# Patient Record
Sex: Female | Born: 2000 | Race: Black or African American | Hispanic: No | Smoking: Never smoker
Health system: Southern US, Community
[De-identification: ages and names within clinical notes are randomized; demographics above are authoritative.]

## PROBLEM LIST (undated history)

## (undated) DIAGNOSIS — J45909 Unspecified asthma, uncomplicated: Secondary | ICD-10-CM

## (undated) DIAGNOSIS — R7303 Prediabetes: Secondary | ICD-10-CM

## (undated) HISTORY — PX: OTHER SURGICAL HISTORY: SHX169

## (undated) HISTORY — DX: Prediabetes: R73.03

## (undated) HISTORY — DX: Unspecified asthma, uncomplicated: J45.909

---
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
## 2021-10-28 ENCOUNTER — Telehealth: Payer: Self-pay | Admitting: Family Medicine

## 2021-10-28 NOTE — Telephone Encounter (Signed)
Client with c/o nausea, vomiting, abdominal pain, back pain, constipation and inability to keep food down. States able to keep down water. Encouraged evaluation with PCP or ED evaluation. Jossie Ng, RN

## 2021-10-28 NOTE — Telephone Encounter (Signed)
Pt is scheduled for a PT on June 5th but she is already having pregnancy symptoms and hasn't been able to keep any food down so she is concerned and would like to speak to a nurse about what she can do about her symptoms.

## 2021-11-07 ENCOUNTER — Other Ambulatory Visit: Payer: Self-pay

## 2021-11-07 ENCOUNTER — Encounter: Payer: Self-pay | Admitting: Emergency Medicine

## 2021-11-07 ENCOUNTER — Emergency Department: Payer: Medicaid Other

## 2021-11-07 ENCOUNTER — Emergency Department
Admission: EM | Admit: 2021-11-07 | Discharge: 2021-11-07 | Disposition: A | Discharge status: Home / Self Care | Payer: Medicaid Other | Attending: Emergency Medicine | Admitting: Emergency Medicine

## 2021-11-07 DIAGNOSIS — O211 Hyperemesis gravidarum with metabolic disturbance: Secondary | ICD-10-CM | POA: Insufficient documentation

## 2021-11-07 DIAGNOSIS — K5900 Constipation, unspecified: Secondary | ICD-10-CM | POA: Diagnosis not present

## 2021-11-07 DIAGNOSIS — O99611 Diseases of the digestive system complicating pregnancy, first trimester: Secondary | ICD-10-CM | POA: Insufficient documentation

## 2021-11-07 DIAGNOSIS — R42 Dizziness and giddiness: Secondary | ICD-10-CM | POA: Insufficient documentation

## 2021-11-07 DIAGNOSIS — R102 Pelvic and perineal pain: Secondary | ICD-10-CM

## 2021-11-07 DIAGNOSIS — R112 Nausea with vomiting, unspecified: Secondary | ICD-10-CM

## 2021-11-07 DIAGNOSIS — Z3A10 10 weeks gestation of pregnancy: Secondary | ICD-10-CM | POA: Diagnosis not present

## 2021-11-07 DIAGNOSIS — O219 Vomiting of pregnancy, unspecified: Secondary | ICD-10-CM | POA: Diagnosis present

## 2021-11-07 DIAGNOSIS — E876 Hypokalemia: Secondary | ICD-10-CM

## 2021-11-07 LAB — URINALYSIS, ROUTINE W REFLEX MICROSCOPIC
Bilirubin Urine: NEGATIVE
Glucose, UA: NEGATIVE mg/dL
Hgb urine dipstick: NEGATIVE
Ketones, ur: NEGATIVE mg/dL
Leukocytes,Ua: NEGATIVE
Nitrite: NEGATIVE
Protein, ur: 30 mg/dL — AB
Specific Gravity, Urine: 1.023 (ref 1.005–1.030)
pH: 5 (ref 5.0–8.0)

## 2021-11-07 LAB — CBC
HCT: 39.1 % (ref 36.0–46.0)
Hemoglobin: 12.3 g/dL (ref 12.0–15.0)
MCH: 25.7 pg — ABNORMAL LOW (ref 26.0–34.0)
MCHC: 31.5 g/dL (ref 30.0–36.0)
MCV: 81.6 fL (ref 80.0–100.0)
Platelets: 206 10*3/uL (ref 150–400)
RBC: 4.79 MIL/uL (ref 3.87–5.11)
RDW: 13.2 % (ref 11.5–15.5)
WBC: 4.6 10*3/uL (ref 4.0–10.5)
nRBC: 0 % (ref 0.0–0.2)

## 2021-11-07 LAB — COMPREHENSIVE METABOLIC PANEL
ALT: 41 U/L (ref 0–44)
AST: 27 U/L (ref 15–41)
Albumin: 4.2 g/dL (ref 3.5–5.0)
Alkaline Phosphatase: 70 U/L (ref 38–126)
Anion gap: 9 (ref 5–15)
BUN: 7 mg/dL (ref 6–20)
CO2: 23 mmol/L (ref 22–32)
Calcium: 9.8 mg/dL (ref 8.9–10.3)
Chloride: 105 mmol/L (ref 98–111)
Creatinine, Ser: 0.4 mg/dL — ABNORMAL LOW (ref 0.44–1.00)
GFR, Estimated: >60 mL/min (ref >60)
Glucose, Bld: 99 mg/dL (ref 70–99)
Potassium: 3 mmol/L — ABNORMAL LOW (ref 3.5–5.1)
Sodium: 137 mmol/L (ref 135–145)
Total Bilirubin: 0.6 mg/dL (ref 0.3–1.2)
Total Protein: 8.2 g/dL — ABNORMAL HIGH (ref 6.5–8.1)

## 2021-11-07 LAB — POC URINE PREG, ED: Preg Test, Ur: POSITIVE — AB

## 2021-11-07 LAB — LIPASE, BLOOD: Lipase: 35 U/L (ref 11–51)

## 2021-11-07 LAB — HCG, QUANTITATIVE, PREGNANCY: hCG, Beta Chain, Quant, S: 83833 m[IU]/mL — ABNORMAL HIGH (ref <5)

## 2021-11-07 MED ORDER — POTASSIUM CHLORIDE CRYS ER 20 MEQ PO TBCR
40.0000 meq | EXTENDED_RELEASE_TABLET | Freq: Once | ORAL | Status: AC
Start: 2021-11-07 — End: 2021-11-07
  Administered 2021-11-07: 40 meq via ORAL
  Filled 2021-11-07: qty 2

## 2021-11-07 MED ORDER — DEXTROSE 5 % IN LACTATED RINGERS IV BOLUS
2000.0000 mL | Freq: Once | INTRAVENOUS | Status: DC
  Filled 2021-11-07: qty 2000

## 2021-11-07 MED ORDER — DEXTROSE IN LACTATED RINGERS 5 % IV SOLN
1000.0000 mL | Freq: Once | INTRAVENOUS | Status: AC
  Administered 2021-11-07: 1000 mL via INTRAVENOUS

## 2021-11-07 NOTE — ED Provider Notes (Signed)
[facility] Provider Note    Event Date/Time   First MD Initiated Contact with Patient 11/07/21 1301     (approximate)   History   multiple medical complaints (Dizziness, vomiting, constipation, pregnancy)   HPI  Tanya Lambert is a 21 y.o. female who presents to the ED for evaluation of multiple medical complaints (Dizziness, vomiting, constipation, pregnancy)   Patient presents to the ED with her partner for evaluation of 3 weeks of feeling badly.  She reports feeling "weird," reports recurrent nausea and emesis.  Reports lower abdominal cramping.  Reports waxing and waning constipation and diarrhea. Reports she is currently 8 or [redacted] weeks pregnant.  This was unwanted.  Has not seen OB yet.  No vaginal bleeding or new discharge, no fever, syncope or falls.   Physical Exam   Triage Vital Signs: ED Triage Vitals  Enc Vitals Group     BP 11/07/21 1231 135/80     Pulse Rate 11/07/21 1231 (!) 114     Resp 11/07/21 1231 16     Temp 11/07/21 1231 98.8 F (37.1 C)     Temp Source 11/07/21 1231 Oral     SpO2 11/07/21 1231 100 %     Weight --      Height --      Head Circumference --      Peak Flow --      Pain Score 11/07/21 1229 3     Pain Loc --      Pain Edu? --      Excl. in GC? --     Most recent vital signs: Vitals:   11/07/21 1231 11/07/21 1601  BP: 135/80 (!) 110/45  Pulse: (!) 114 81  Resp: 16 18  Temp: 98.8 F (37.1 C)   SpO2: 100% 100%    General: Awake, no distress.  Well-appearing. CV:  Good peripheral perfusion.  Resp:  Normal effort.  Abd:  No distention.  Soft and benign MSK:  No deformity noted.  Neuro:  No focal deficits appreciated. Other:     ED Results / Procedures / Treatments   Labs (all labs ordered are listed, but only abnormal results are displayed) Labs Reviewed  COMPREHENSIVE METABOLIC PANEL - Abnormal; Notable for the following components:      Result Value   Potassium 3.0 (*)    Creatinine, Ser 0.40  (*)    Total Protein 8.2 (*)    All other components within normal limits  CBC - Abnormal; Notable for the following components:   MCH 25.7 (*)    All other components within normal limits  URINALYSIS, ROUTINE W REFLEX MICROSCOPIC - Abnormal; Notable for the following components:   Color, Urine YELLOW (*)    APPearance HAZY (*)    Protein, ur 30 (*)    Bacteria, UA RARE (*)    All other components within normal limits  HCG, QUANTITATIVE, PREGNANCY - Abnormal; Notable for the following components:   hCG, Beta Chain, Quant, [place] 16,010 (*)    All other components within normal limits  POC URINE PREG, ED - Abnormal; Notable for the following components:   Preg Test, Ur POSITIVE (*)    All other components within normal limits  LIPASE, BLOOD    EKG   RADIOLOGY Obstetric ultrasound interpreted by me with IUP in place  Official radiology report(s): US OB Comp Less 14 Wks  Result Date: 11/07/2021 CLINICAL DATA:  [id number]. Diffuse pelvic pain. Quantitative beta HCG [id number]. Unsure  last menstrual period. EXAM: OBSTETRIC <14 WK [place] AND TRANSVAGINAL OB US TECHNIQUE: Both transabdominal and transvaginal ultrasound examinations were performed for complete evaluation of the gestation as well as the maternal uterus, adnexal regions, and pelvic cul-de-sac. Transvaginal technique was performed to assess early pregnancy. COMPARISON:  None Available. FINDINGS: Intrauterine gestational sac: Single Yolk sac:  Visualized. Embryo:  Visualized. Cardiac Activity: Visualized. Heart Rate: 176  bpm CRL: 36.2  mm   10 w   4 d                  [place] EDC: 06/01/22 Subchorionic hemorrhage:  None visualized. Maternal uterus/adnexae: Bilateral ovaries are unremarkable. Other: No free pelvic fluid. IMPRESSION: Single live intrauterine pregnancy with gestational age of [redacted] weeks and 4 days. Electronically Signed   By: Tish Frederickson M.D.   On: 11/07/2021 15:25    PROCEDURES and INTERVENTIONS:  Procedures  Medications   potassium chloride SA (KLOR-CON M) CR tablet 40 mEq (40 mEq Oral Given 11/07/21 1522)  dextrose 5 % in lactated ringers infusion (1,000 mLs Intravenous New Bag/Given 11/07/21 1523)    Followed by  dextrose 5 % in lactated ringers infusion (1,000 mLs Intravenous New Bag/Given 11/07/21 1521)     IMPRESSION / MDM / ASSESSMENT AND PLAN / ED COURSE  I reviewed the triage vital signs and the nursing notes.  Differential diagnosis includes, but is not limited to, ectopic pregnancy, hyperemesis gravidarum, dehydration, hypokalemia, miscarriage  {Patient presents with symptoms of an acute illness or injury that is potentially life-threatening.  21 year old female presents to the ED with first trimester emesis, dizziness.  Has some signs of hypokalemia and mild dehydration resolved with IV fluids.  Urine without infectious features and CBC is normal.  Normal lipase.  hCG is appropriately elevated for her gestational age.  Potential ultrasound obtained due to her pain and this is without evidence of acute pathology, IUP in place.  No ectopic.  Tolerating p.o. and improved symptoms with fluids and repletion of her potassium.  We will discharge with referral to OB/GYN and return precautions for the ED.  Clinical Course as of 11/07/21 1700  Fri Nov 07, 2021  1559 Reassessed.  Feeling better.  We discussed reassuring ultrasound [DS]  1655 Reassessed.  Feeling much better after the IV fluids.  Tachycardia has resolved. [DS]    Clinical Course User Index [DS] Delton Prairie, MD     FINAL CLINICAL IMPRESSION(S) / ED DIAGNOSES   Final diagnoses:  [redacted] weeks gestation of pregnancy  Nausea and vomiting, unspecified vomiting type  Hypokalemia     Rx / DC Orders   ED Discharge Orders     None        Note:  This document was prepared using Dragon voice recognition software and may include unintentional dictation errors.   Delton Prairie, MD 11/07/21 [phone]

## 2021-11-07 NOTE — ED Triage Notes (Signed)
Pt to ED via POV when asked why she is here pt states that she is having nausea, vomiting, constipation, abdominal pain, weight loss, and pregnancy. Pt states that she is approximately 8 weeks pregnancy, G1P0

## 2021-11-07 NOTE — Discharge Instructions (Addendum)
Use Tylenol for pain and fevers.  Up to 1000 mg per dose, up to 4 times per day.  Do not take more than 4000 mg of Tylenol/acetaminophen within 24 hours..  

## 2021-11-07 NOTE — ED Notes (Signed)
Pt w/ [place] at this time

## 2022-02-26 ENCOUNTER — Ambulatory Visit: Payer: Medicaid Other | Admitting: Physician Assistant

## 2022-02-26 ENCOUNTER — Other Ambulatory Visit: Payer: Self-pay

## 2022-02-26 ENCOUNTER — Encounter: Payer: Self-pay | Admitting: Advanced Practice Midwife

## 2022-02-26 VITALS — BP 117/67 | HR 78 | Temp 97.6°F | Wt 209.0 lb

## 2022-02-26 DIAGNOSIS — Z3402 Encounter for supervision of normal first pregnancy, second trimester: Secondary | ICD-10-CM | POA: Diagnosis not present

## 2022-02-26 DIAGNOSIS — Z23 Encounter for immunization: Secondary | ICD-10-CM | POA: Diagnosis not present

## 2022-02-26 DIAGNOSIS — O0932 Supervision of pregnancy with insufficient antenatal care, second trimester: Secondary | ICD-10-CM

## 2022-02-26 DIAGNOSIS — O99012 Anemia complicating pregnancy, second trimester: Secondary | ICD-10-CM

## 2022-02-26 DIAGNOSIS — O99212 Obesity complicating pregnancy, second trimester: Secondary | ICD-10-CM

## 2022-02-26 DIAGNOSIS — Z8709 Personal history of other diseases of the respiratory system: Secondary | ICD-10-CM

## 2022-02-26 LAB — HEMOGLOBIN, FINGERSTICK: Hemoglobin: 10.3 g/dL — ABNORMAL LOW (ref 11.1–15.9)

## 2022-02-26 MED ORDER — IRON (FERROUS SULFATE) 325 (65 FE) MG PO TABS: 1.0000 | ORAL_TABLET | Freq: Every day | ORAL | 0 refills | Status: AC

## 2022-02-26 MED ORDER — PROMETHAZINE HCL 25 MG PO TABS: 25.0000 mg | ORAL_TABLET | Freq: Four times a day (QID) | ORAL | 2 refills | Status: DC | PRN

## 2022-02-26 NOTE — Progress Notes (Signed)
Presents for initiation of prenatal care. To [facility] ED 11/07/21 for N/V, dizziness and constipation. US done with EGA = 10 4/7 and EDD = 06/01/2022. Does not remember LMP or pregravid wt. Last wt she remembers is 199# taken on 11/07/2021. Peak flows = 410, 460, 460 with good effort and A. Streilein PA-C aware. Rich Number, RN Hgb = 10.3 and iron initiated per standing orders. [facility] [place] referral with snapshot pages and [place] from 11/07/21 faxed. Fax confirmation received. Client counseled [facility] will notify her of [place] appt via phone call and will only make 1 call QD x3 in effort to reach her. Per client, she will clear out phone messages in voicemail. Rich Number, RN

## 2022-02-26 NOTE — Progress Notes (Signed)
[place] Department  Maternal Health Clinic   INITIAL PRENATAL VISIT NOTE  Subjective:  Tanya Lambert is a 21 y.o. G1P0 at [redacted]w[redacted]d being seen today to start prenatal care at the [facility].  She is currently monitored for the following issues for this high-risk pregnancy and has Late prenatal care affecting pregnancy in second trimester; Encounter for supervision of normal first pregnancy in second trimester; Obesity affecting pregnancy in second trimester; Hx of intrinsic asthma; and Anemia affecting pregnancy in second trimester on their problem list.  Patient reports  occ nausea, bilat painful hardness some areas breasts (has been massaging them to promote milk supply in anticipation of breastfeeding) .  Contractions: Not present. Vag. Bleeding: None.  Movement: Present. Denies leaking of fluid.   Indications for ASA therapy (per uptodate) One of the following: Previous pregnancy with preeclampsia, especially early onset and with an adverse outcome No Multifetal gestation No Chronic hypertension No Type 1 or 2 diabetes mellitus No Chronic kidney disease No Autoimmune disease (antiphospholipid syndrome, systemic lupus erythematosus) No  Two or more of the following: Nulliparity Yes Obesity (body mass index >30 kg/m2) Yes Family history of preeclampsia in mother or sister No Age ?35 years No Sociodemographic characteristics (African American race, low socioeconomic level) Yes Personal risk factors (eg, previous pregnancy with low birth weight or small for gestational age infant, previous adverse pregnancy outcome [eg, stillbirth], interval >10 years between pregnancies) No   The following portions of the patient's history were reviewed and updated as appropriate: allergies, current medications, past family history, past medical history, past social history, past surgical history and problem list. Problem list updated.  Objective:   Vitals:   02/26/22  0845  BP: 117/67  Pulse: 78  Temp: 97.6 F (36.4 C)  Weight: 209 lb (94.8 kg)    Fetal Status: Fetal Heart Rate (bpm): 148 Fundal Height: 25 cm Movement: Present  Presentation: Undeterminable  Physical Exam Vitals and nursing note reviewed.  Constitutional:      General: She is not in acute distress.    Appearance: Normal appearance. She is well-developed. She is obese.  HENT:     Head: Normocephalic and atraumatic.     Right Ear: External ear normal.     Left Ear: External ear normal.     Nose: Nose normal. No congestion or rhinorrhea.     Mouth/Throat:     Lips: Pink.     Mouth: Mucous membranes are moist.     Dentition: Normal dentition. No dental caries.     Pharynx: Oropharynx is clear. Uvula midline.     Comments: Dentition: teeth in good repair Eyes:     General: No scleral icterus.    Conjunctiva/sclera: Conjunctivae normal.  Neck:     Thyroid: No thyroid mass or thyromegaly.  Cardiovascular:     Rate and Rhythm: Normal rate and regular rhythm.     Pulses: Normal pulses.     Heart sounds: Normal heart sounds.     Comments: Extremities are warm and well perfused Pulmonary:     Effort: Pulmonary effort is normal.     Breath sounds: Normal breath sounds.  Chest:     Chest wall: No mass.  Breasts:    Tanner Score is 5.     Breasts are symmetrical.     Right: Normal. No mass, nipple discharge or skin change.     Left: Normal. No mass, nipple discharge or skin change.  Abdominal:  General: Abdomen is protuberant.     Palpations: Abdomen is soft.     Tenderness: There is no abdominal tenderness.     Comments: Gravid   Genitourinary:    General: Normal vulva.     Exam position: Lithotomy position.     Pubic Area: No rash.      Labia:        Right: No rash.        Left: No rash.      Vagina: Normal. No vaginal discharge.     Cervix: No cervical motion tenderness or friability.     Uterus: Normal. Enlarged (Gravid >20 week size). Not tender.      Adnexa:  Right adnexa normal and left adnexa normal.     Rectum: Normal. No external hemorrhoid.  Musculoskeletal:     Right lower leg: No edema.     Left lower leg: No edema.  Lymphadenopathy:     Cervical: No cervical adenopathy.     Upper Body:     Right upper body: No axillary adenopathy.     Left upper body: No axillary adenopathy.  Skin:    General: Skin is warm.     Capillary Refill: Capillary refill takes less than 2 seconds.  Neurological:     Mental Status: She is alert and oriented to person, place, and time.     Assessment and Plan:  Pregnancy: G1P0 at [redacted]w[redacted]d  1. Encounter for supervision of normal first pregnancy in second trimester Indications for aspirin, but too late in pregnancy for clear benefit. Schedule fetal anat [place]. Discussed non-pharm and pharm options for nausea of preg, pt desired Rx. - Prenatal Profile I - Hgb Fractionation Cascade - Glucose, 1 hour - Hgb A1c w/o eAG - Comprehensive metabolic panel - TSH - Protein / creatinine ratio, urine - MaterniT 21 plus Core, Blood - Hemoglobin, venipuncture - promethazine (PHENERGAN) 25 MG tablet; Take 1 tablet (25 mg total) by mouth every 6 (six) hours as needed for nausea or vomiting.  Dispense: 30 tablet; Refill: 2  2. Obesity affecting pregnancy in second trimester Discussed 11-20lb TWG recommendation.  - Amb ref to Medical Nutrition Therapy-MNT  3. Late prenatal care affecting pregnancy in second trimester OBCM referral done.  4. Hx of intrinsic asthma Peak flows done today. Monitor for sx. Last in early childhood.  5. Anemia affecting pregnancy in second trimester Anemia panel today.   Discussed overview of care and coordination with inpatient delivery practices including [facility], [facility], [facility] and [facility].    Preterm labor symptoms and general obstetric precautions including but not limited to vaginal bleeding, contractions, leaking of fluid and fetal movement were reviewed in detail with  the patient.  Please refer to After Visit Summary for other counseling recommendations.   Return in about 2 weeks (around 03/12/2022) for 28 wk labs (before 10:30am or 3pm), Routine prenatal care.  Future Appointments  Date Time Provider [place]  03/09/2022  8:20 AM [facility] PROVIDER [facility] None    Lora Havens, PA-C

## 2022-02-27 LAB — FE+CBC/D/PLT+TIBC+FER+RETIC
Basophils Absolute: 0 10*3/uL (ref 0.0–0.2)
Basos: 0 %
EOS (ABSOLUTE): 0.1 10*3/uL (ref 0.0–0.4)
Eos: 1 %
Ferritin: 16 ng/mL (ref 15–150)
Hematocrit: 33 % — ABNORMAL LOW (ref 34.0–46.6)
Hemoglobin: 10.5 g/dL — ABNORMAL LOW (ref 11.1–15.9)
Immature Grans (Abs): 0 10*3/uL (ref 0.0–0.1)
Immature Granulocytes: 0 %
Iron Saturation: 7 % — CL (ref 15–55)
Iron: 32 ug/dL (ref 27–159)
Lymphocytes Absolute: 1.9 10*3/uL (ref 0.7–3.1)
Lymphs: 23 %
MCH: 28.2 pg (ref 26.6–33.0)
MCHC: 31.8 g/dL (ref 31.5–35.7)
MCV: 89 fL (ref 79–97)
Monocytes Absolute: 0.4 10*3/uL (ref 0.1–0.9)
Monocytes: 5 %
Neutrophils Absolute: 5.7 10*3/uL (ref 1.4–7.0)
Neutrophils: 71 %
Platelets: 287 10*3/uL (ref 150–450)
RBC: 3.72 x10E6/uL — ABNORMAL LOW (ref 3.77–5.28)
RDW: 13.6 % (ref 11.7–15.4)
Retic Ct Pct: 2.5 % (ref 0.6–2.6)
Total Iron Binding Capacity: 444 ug/dL (ref 250–450)
UIBC: 412 ug/dL (ref 131–425)
WBC: 8 10*3/uL (ref 3.4–10.8)

## 2022-02-27 LAB — PROTEIN / CREATININE RATIO, URINE
Creatinine, Urine: 63.1 mg/dL
Protein, Ur: 7.3 mg/dL
Protein/Creat Ratio: 116 mg/g creat (ref 0–200)

## 2022-03-02 ENCOUNTER — Telehealth: Payer: Self-pay

## 2022-03-02 NOTE — Telephone Encounter (Signed)
[facility] [place] referral faxed 02/26/2022 with confirmation received. As no appt yet scheduled, call to [facility] OB US scheduler Tye Maryland). Per Tye Maryland, referral has not been received. [facility] [place] referral, snapshot pages and prior [place] report faxed again with fax confirmation received. Rich Number, RN

## 2022-03-02 NOTE — Telephone Encounter (Signed)
Phone encounter opened in error. Rich Number, RN

## 2022-03-05 NOTE — Progress Notes (Signed)
Per [facility] Epic, [place] appt scheduled for 03/10/2022 at 1100. [place] will be done at [place]. Rich Number, RN

## 2022-03-06 ENCOUNTER — Encounter: Payer: Self-pay | Admitting: Advanced Practice Midwife

## 2022-03-06 DIAGNOSIS — E0590 Thyrotoxicosis, unspecified without thyrotoxic crisis or storm: Secondary | ICD-10-CM | POA: Insufficient documentation

## 2022-03-06 LAB — PREGNANCY, INITIAL SCREEN
Antibody Screen: NEGATIVE
Basophils Absolute: 0 10*3/uL (ref 0.0–0.2)
Basos: 0 %
Bilirubin, UA: NEGATIVE
Chlamydia trachomatis, NAA: NEGATIVE
EOS (ABSOLUTE): 0 10*3/uL (ref 0.0–0.4)
Eos: 1 %
Glucose, UA: NEGATIVE
HCV Ab: NONREACTIVE
HIV Screen 4th Generation wRfx: NONREACTIVE
Hematocrit: 32 % — ABNORMAL LOW (ref 34.0–46.6)
Hemoglobin: 10.2 g/dL — ABNORMAL LOW (ref 11.1–15.9)
Hepatitis B Surface Ag: NEGATIVE
Immature Grans (Abs): 0 10*3/uL (ref 0.0–0.1)
Immature Granulocytes: 0 %
Ketones, UA: NEGATIVE
Leukocytes,UA: NEGATIVE
Lymphocytes Absolute: 1.8 10*3/uL (ref 0.7–3.1)
Lymphs: 22 %
MCH: 27.1 pg (ref 26.6–33.0)
MCHC: 31.9 g/dL (ref 31.5–35.7)
MCV: 85 fL (ref 79–97)
Monocytes Absolute: 0.4 10*3/uL (ref 0.1–0.9)
Monocytes: 5 %
Neisseria Gonorrhoeae by PCR: NEGATIVE
Neutrophils Absolute: 5.8 10*3/uL (ref 1.4–7.0)
Neutrophils: 72 %
Nitrite, UA: NEGATIVE
Platelets: 286 10*3/uL (ref 150–450)
Protein,UA: NEGATIVE
RBC, UA: NEGATIVE
RBC: 3.77 x10E6/uL (ref 3.77–5.28)
RDW: 13.2 % (ref 11.7–15.4)
RPR Ser Ql: NONREACTIVE
Rh Factor: POSITIVE
Rubella Antibodies, IGG: 21 index (ref >0.99)
Specific Gravity, UA: 1.012 (ref 1.005–1.030)
Urobilinogen, Ur: 0.2 mg/dL (ref 0.2–1.0)
WBC: 8.1 10*3/uL (ref 3.4–10.8)
pH, UA: 6.5 (ref 5.0–7.5)

## 2022-03-06 LAB — MATERNIT 21 PLUS CORE, BLOOD
Fetal Fraction: 5
Result (T21): NEGATIVE
Trisomy 13 (Patau syndrome): NEGATIVE
Trisomy 18 (Edwards syndrome): NEGATIVE
Trisomy 21 (Down syndrome): NEGATIVE

## 2022-03-06 LAB — COMPREHENSIVE METABOLIC PANEL
ALT: 11 IU/L (ref 0–32)
AST: 11 IU/L (ref 0–40)
Albumin/Globulin Ratio: 1.5 (ref 1.2–2.2)
Albumin: 3.9 g/dL — ABNORMAL LOW (ref 4.0–5.0)
Alkaline Phosphatase: 57 IU/L (ref 42–106)
BUN/Creatinine Ratio: 7 — ABNORMAL LOW (ref 9–23)
BUN: 3 mg/dL — ABNORMAL LOW (ref 6–20)
Bilirubin Total: <0.2 mg/dL (ref 0.0–1.2)
CO2: 19 mmol/L — ABNORMAL LOW (ref 20–29)
Calcium: 9 mg/dL (ref 8.7–10.2)
Chloride: 104 mmol/L (ref 96–106)
Creatinine, Ser: 0.44 mg/dL — ABNORMAL LOW (ref 0.57–1.00)
Globulin, Total: 2.6 g/dL (ref 1.5–4.5)
Glucose: 58 mg/dL — ABNORMAL LOW (ref 70–99)
Potassium: 3.9 mmol/L (ref 3.5–5.2)
Sodium: 140 mmol/L (ref 134–144)
Total Protein: 6.5 g/dL (ref 6.0–8.5)
eGFR: 142 mL/min/{1.73_m2} (ref >59)

## 2022-03-06 LAB — MICROSCOPIC EXAMINATION
Bacteria, UA: NONE SEEN
Casts: NONE SEEN /lpf
RBC, Urine: NONE SEEN /hpf (ref 0–2)
WBC, UA: NONE SEEN /hpf (ref 0–5)

## 2022-03-06 LAB — HGB FRACTIONATION CASCADE
Hgb A2: 2.8 % (ref 1.8–3.2)
Hgb A: 97.2 % (ref 96.4–98.8)
Hgb F: 0 % (ref 0.0–2.0)
Hgb S: 0 %

## 2022-03-06 LAB — GLUCOSE, 1 HOUR GESTATIONAL: Gestational Diabetes Screen: 59 mg/dL — ABNORMAL LOW (ref 70–139)

## 2022-03-06 LAB — HGB A1C W/O EAG: Hgb A1c MFr Bld: 4.9 % (ref 4.8–5.6)

## 2022-03-06 LAB — HCV INTERPRETATION

## 2022-03-06 LAB — TSH: TSH: <0.005 u[IU]/mL — ABNORMAL LOW (ref 0.450–4.500)

## 2022-03-06 LAB — URINE CULTURE, OB REFLEX

## 2022-03-09 ENCOUNTER — Ambulatory Visit: Payer: Medicaid Other | Admitting: Advanced Practice Midwife

## 2022-03-09 ENCOUNTER — Telehealth: Payer: Self-pay

## 2022-03-09 VITALS — BP 116/66 | HR 81 | Temp 97.2°F | Wt 213.0 lb

## 2022-03-09 DIAGNOSIS — O99280 Endocrine, nutritional and metabolic diseases complicating pregnancy, unspecified trimester: Secondary | ICD-10-CM

## 2022-03-09 DIAGNOSIS — Z3402 Encounter for supervision of normal first pregnancy, second trimester: Secondary | ICD-10-CM

## 2022-03-09 DIAGNOSIS — O99012 Anemia complicating pregnancy, second trimester: Secondary | ICD-10-CM

## 2022-03-09 DIAGNOSIS — O99212 Obesity complicating pregnancy, second trimester: Secondary | ICD-10-CM

## 2022-03-09 DIAGNOSIS — Z23 Encounter for immunization: Secondary | ICD-10-CM

## 2022-03-09 DIAGNOSIS — O0932 Supervision of pregnancy with insufficient antenatal care, second trimester: Secondary | ICD-10-CM

## 2022-03-09 DIAGNOSIS — E0590 Thyrotoxicosis, unspecified without thyrotoxic crisis or storm: Secondary | ICD-10-CM

## 2022-03-09 LAB — HEMOGLOBIN, FINGERSTICK: Hemoglobin: 10.6 g/dL — ABNORMAL LOW (ref 11.1–15.9)

## 2022-03-09 NOTE — Progress Notes (Signed)
Hgb reviewed - patient educated to continue taking iron x1 daily with juice.   Al Decant, RN

## 2022-03-09 NOTE — Progress Notes (Signed)
[place] Department M[facility] Health Clinic  PRENATAL VISIT NOTE  Subjective:  Tanya Lambert is a 21 y.o. G1P0 at [redacted]w[redacted]d being seen today for ongoing prenatal care.  She is currently monitored for the following issues for this high-risk pregnancy and has Late prenatal care affecting pregnancy in second trimester 26 wks; Encounter for supervision of normal first pregnancy in second trimester; Obesity affecting pregnancy in second trimester; Hx of intrinsic asthma; Anemia affecting pregnancy in second trimester; and possible hyperthyroidism on their problem list.  Patient reports no complaints.  Contractions: Not present. Vag. Bleeding: None.  Movement: Present. Denies leaking of fluid/ROM.   The following portions of the patient's history were reviewed and updated as appropriate: allergies, current medications, past family history, past medical history, past social history, past surgical history and problem list. Problem list updated.  Objective:   Vitals:   03/09/22 0818  BP: 116/66  Pulse: 81  Temp: (!) 97.2 F (36.2 C)  Weight: 213 lb (96.6 kg)    Fetal Status: Fetal Heart Rate (bpm): 130 Fundal Height: 27 cm Movement: Present     General:  Alert, oriented and cooperative. Patient is in no acute distress.  Skin: Skin is warm and dry. No rash noted.   Cardiovascular: Normal heart rate noted  Respiratory: Normal respiratory effort, no problems with respiration noted  Abdomen: Soft, gravid, appropriate for gestational age.  Pain/Pressure: Absent     Pelvic: Cervical exam deferred        Extremities: Normal range of motion.  Edema: None  Mental Status: Normal mood and affect. Normal behavior. Normal judgment and thought content.   Assessment and Plan:  Pregnancy: G1P0 at [redacted]w[redacted]d  1. Encounter for supervision of normal first pregnancy in second trimester Working 30 hrs/wk Living with partner who is transgender and here 28 wk labs today Last took Phenergan at 0500 this  am for nausea; last vomited 1.5-2 wks ago; suggestions given for high protein snacks and to wean off Phenergan Has had dating u/s on 11/07/21 at 10 4/7 wks Anatomy u/s 03/10/22 Not exercising--recommended 3x/wk x 20 min NIPS 02/26/22=neg  - Glucose, 1 hour gestational - HIV-1/HIV-2 Qualitative RNA - RPR - Tdap vaccine greater than or equal to 7yo IM  2. Late prenatal care affecting pregnancy in second trimester At 26 wks - [id number] Drug Screen  3. Hyperthyroidism affecting pregnancy, antepartum Low TSH at last apt. To repeat and add free T4 today Referral written last week for MFM consult for r/o hyperthyroidism  - TSH + free T4  4. Anemia affecting pregnancy in second trimester Taking FeSo4 I daily with oj - Hemoglobin, fingerstick  5. Obesity affecting pregnancy in second trimester, unspecified obesity type 14 lb (6.35 kg) Too late in care to initiate ASA daily 4 lb wt gain in past 2 wks   Preterm labor symptoms and general obstetric precautions including but not limited to vaginal bleeding, contractions, leaking of fluid and fetal movement were reviewed in detail with the patient. Please refer to After Visit Summary for other counseling recommendations.  Return in about 2 weeks (around 03/23/2022) for routine PNC.  No future appointments.  Herbie Saxon, CNM

## 2022-03-09 NOTE — Telephone Encounter (Signed)
Call to client to remind her of am appt today as Free T4 needs to be drawn ASAP per E. [facility] CNM result note. Per client, she plans on keeping appt this am. Rich Number, RN

## 2022-03-11 LAB — 789231 7+OXYCODONE-BUND
Amphetamines, Urine: NEGATIVE ng/mL
BENZODIAZ UR QL: NEGATIVE ng/mL
Barbiturate screen, urine: NEGATIVE ng/mL
Cannabinoid Quant, Ur: NEGATIVE ng/mL
Cocaine (Metab.): NEGATIVE ng/mL
OPIATE SCREEN URINE: NEGATIVE ng/mL
Oxycodone/Oxymorphone, Urine: NEGATIVE ng/mL
PCP Quant, Ur: NEGATIVE ng/mL

## 2022-03-11 LAB — HIV-1/HIV-2 QUALITATIVE RNA
HIV-1 RNA, Qualitative: NONREACTIVE
HIV-2 RNA, Qualitative: NONREACTIVE

## 2022-03-11 LAB — TSH+FREE T4
Free T4: 1.31 ng/dL (ref 0.82–1.77)
TSH: <0.005 u[IU]/mL — ABNORMAL LOW (ref 0.450–4.500)

## 2022-03-11 LAB — RPR: RPR Ser Ql: NONREACTIVE

## 2022-03-11 LAB — GLUCOSE, 1 HOUR GESTATIONAL: Gestational Diabetes Screen: 71 mg/dL (ref 70–139)

## 2022-03-11 NOTE — Progress Notes (Signed)
TSH and Free T4 lab faxed to [facility] for patient's upcoming visit on 03/26/22. Confirmation received.Jenetta Downer, RN

## 2022-03-11 NOTE — Addendum Note (Signed)
Addended by: Cletis Media on: 03/11/2022 12:40 PM   Modules accepted: Orders

## 2022-03-12 ENCOUNTER — Other Ambulatory Visit: Payer: Self-pay | Admitting: Physician Assistant

## 2022-03-12 ENCOUNTER — Telehealth: Payer: Self-pay

## 2022-03-12 DIAGNOSIS — Z3402 Encounter for supervision of normal first pregnancy, second trimester: Secondary | ICD-10-CM

## 2022-03-12 MED ORDER — PRENATAL 27-0.8 MG PO TABS: 1.0000 | ORAL_TABLET | Freq: Every day | ORAL | 3 refills | Status: AC

## 2022-03-12 NOTE — Telephone Encounter (Signed)
Paper telephone call documentation brought to [facility] by Cleta Alberts with message from client inquiring about prenatal vitamin prescription (as not at [employer] in [place]). Unable to ascertain from my Epic view if client has Medicaid and at 4:45 pm, unable to reach anyone in [facility] clerical. Call to client to notify her I would check with clerical in the am and if has Medicaid would have provider e-scribe and if not, [facility] could provide her with PNVs. Client's partner answered phone and above explained to her. Per partner, client does indeed have Medicaid. Counseled that provider would e-prescribe prenatal vitamins and if client has problems retrieving to notify clinic. Cephus Richer PA-C e-prescribing vitamins. Rich Number, RN

## 2022-03-23 ENCOUNTER — Ambulatory Visit: Payer: Medicaid Other

## 2022-03-23 ENCOUNTER — Telehealth: Payer: Self-pay

## 2022-03-23 NOTE — Telephone Encounter (Signed)
[facility] for [facility] RV 03/23/2022 am. Call to client to reschedule appt and per recorded message, voicemail box is full. Rich Number, RN

## 2022-03-23 NOTE — Telephone Encounter (Signed)
Per Epic appt desk, client rescheduled appt for 03/30/2022. Rich Number, RN

## 2022-03-26 NOTE — Addendum Note (Signed)
Addended by: Cletis Media on: 03/26/2022 02:10 PM   Modules accepted: Orders

## 2022-03-30 ENCOUNTER — Ambulatory Visit: Payer: Medicaid Other | Admitting: Advanced Practice Midwife

## 2022-03-30 VITALS — BP 119/71 | HR 77 | Temp 97.5°F | Wt 219.2 lb

## 2022-03-30 DIAGNOSIS — O99212 Obesity complicating pregnancy, second trimester: Secondary | ICD-10-CM

## 2022-03-30 DIAGNOSIS — E0590 Thyrotoxicosis, unspecified without thyrotoxic crisis or storm: Secondary | ICD-10-CM

## 2022-03-30 DIAGNOSIS — O99012 Anemia complicating pregnancy, second trimester: Secondary | ICD-10-CM

## 2022-03-30 DIAGNOSIS — O0932 Supervision of pregnancy with insufficient antenatal care, second trimester: Secondary | ICD-10-CM

## 2022-03-30 DIAGNOSIS — O99013 Anemia complicating pregnancy, third trimester: Secondary | ICD-10-CM

## 2022-03-30 DIAGNOSIS — O99213 Obesity complicating pregnancy, third trimester: Secondary | ICD-10-CM

## 2022-03-30 DIAGNOSIS — Z3403 Encounter for supervision of normal first pregnancy, third trimester: Secondary | ICD-10-CM

## 2022-03-30 DIAGNOSIS — O0933 Supervision of pregnancy with insufficient antenatal care, third trimester: Secondary | ICD-10-CM

## 2022-03-30 DIAGNOSIS — Z3402 Encounter for supervision of normal first pregnancy, second trimester: Secondary | ICD-10-CM

## 2022-03-30 DIAGNOSIS — O99280 Endocrine, nutritional and metabolic diseases complicating pregnancy, unspecified trimester: Secondary | ICD-10-CM

## 2022-03-30 NOTE — Progress Notes (Signed)
[place] Department [facility] Clinic  PRENATAL VISIT NOTE  Subjective:  Tanya Lambert is a 21 y.o. G1P0 at [redacted]w[redacted]d being seen today for ongoing prenatal care.  She is currently monitored for the following issues for this low-risk pregnancy and has Late prenatal care affecting pregnancy in second trimester 26 wks; Encounter for supervision of normal first pregnancy in second trimester; Obesity affecting pregnancy in second trimester; Hx of intrinsic asthma; Anemia affecting pregnancy in second trimester; and possible hyperthyroidism--subclinical hyperthyroidism (no tx needed currently but check pp) on their problem list.  Patient reports  leg cramps .  Contractions: Not present. Vag. Bleeding: None.  Movement: Present. Denies leaking of fluid/ROM.   The following portions of the patient's history were reviewed and updated as appropriate: allergies, current medications, past family history, past medical history, past social history, past surgical history and problem list. Problem list updated.  Objective:   Vitals:   03/30/22 1429  BP: 119/71  Pulse: 77  Temp: (!) 97.5 F (36.4 C)  Weight: 219 lb 3.2 oz (99.4 kg)    Fetal Status: Fetal Heart Rate (bpm): 130 Fundal Height: 31 cm Movement: Present     General:  Alert, oriented and cooperative. Patient is in no acute distress.  Skin: Skin is warm and dry. No rash noted.   Cardiovascular: Normal heart rate noted  Respiratory: Normal respiratory effort, no problems with respiration noted  Abdomen: Soft, gravid, appropriate for gestational age.  Pain/Pressure: Absent     Pelvic: Cervical exam deferred        Extremities: Normal range of motion.  Edema: None  Mental Status: Normal mood and affect. Normal behavior. Normal judgment and thought content.   Assessment and Plan:  Pregnancy: G1P0 at [redacted]w[redacted]d  1. Encounter for supervision of normal first pregnancy in second trimester Working 30 hrs/wk Here with partner C/o leg  cramps--suggestions given Not exercising--encouraged again to do so regularly To go to [facility] now for MNT 1 hour glucola on 03/09/22=71 Reviewed 03/10/22 u/s at 28 2/7 with EFW=39%, AFI wnl, posterior placenta, anatomy wnl  2. Hyperthyroidism affecting pregnancy, antepartum Kept [facility] apt 03/26/22 with subclinical hyperthyroidism; no tx needed currently but to check pp  3. Anemia affecting pregnancy in second trimester Taking FeSo4 I daily with oj  4. Late prenatal care affecting pregnancy in second trimester 26 wks   5. Obesity affecting pregnancy in second trimester, unspecified obesity type 20 lb 3.2 oz (9.163 kg) Gained 6 lbs in last 3 wks Too late in care for ASA 81 mg   Preterm labor symptoms and general obstetric precautions including but not limited to vaginal bleeding, contractions, leaking of fluid and fetal movement were reviewed in detail with the patient. Please refer to After Visit Summary for other counseling recommendations.  Return in about 2 weeks (around 04/13/2022) for routine PNC.  No future appointments.  Herbie Saxon, CNM

## 2022-03-30 NOTE — Progress Notes (Signed)
Patient here with partner for MH RV at 31 weeks. Needs an MNT appointment due to confusion of last appointment. Kick counts reviewed, cards given. [facility] contact card given.Marland KitchenMarland KitchenJenetta Downer, RN

## 2022-04-13 ENCOUNTER — Ambulatory Visit: Payer: Medicaid Other | Admitting: Advanced Practice Midwife

## 2022-04-13 VITALS — BP 112/65 | HR 71 | Temp 98.1°F | Wt 222.8 lb

## 2022-04-13 DIAGNOSIS — Z3403 Encounter for supervision of normal first pregnancy, third trimester: Secondary | ICD-10-CM

## 2022-04-13 DIAGNOSIS — O99212 Obesity complicating pregnancy, second trimester: Secondary | ICD-10-CM

## 2022-04-13 DIAGNOSIS — O0932 Supervision of pregnancy with insufficient antenatal care, second trimester: Secondary | ICD-10-CM

## 2022-04-13 DIAGNOSIS — Z3402 Encounter for supervision of normal first pregnancy, second trimester: Secondary | ICD-10-CM

## 2022-04-13 DIAGNOSIS — O99013 Anemia complicating pregnancy, third trimester: Secondary | ICD-10-CM

## 2022-04-13 DIAGNOSIS — O99213 Obesity complicating pregnancy, third trimester: Secondary | ICD-10-CM

## 2022-04-13 DIAGNOSIS — O99012 Anemia complicating pregnancy, second trimester: Secondary | ICD-10-CM

## 2022-04-13 DIAGNOSIS — O0933 Supervision of pregnancy with insufficient antenatal care, third trimester: Secondary | ICD-10-CM

## 2022-04-13 LAB — HEMOGLOBIN, FINGERSTICK: Hemoglobin: 11.9 g/dL (ref 11.1–15.9)

## 2022-04-13 NOTE — Progress Notes (Signed)
Correctly verbalizes how to take iron and prenatal vitamin. Taking iron tablet with juice. Hgb today. Verified has [facility] contact card. Plans to select pediatrician this week - resource list given as unable to locate other list. Rich Number, RN Hgb = 11.9 and to continue iron QD x next 2 months and client so counseled with verbalized understanding. Rich Number, RN

## 2022-04-13 NOTE — Progress Notes (Signed)
[place] Department [facility] Clinic  PRENATAL VISIT NOTE  Subjective:  Tanya Lambert is a 21 y.o. G1P0 at [redacted]w[redacted]d being seen today for ongoing prenatal care.  She is currently monitored for the following issues for this low-risk pregnancy and has Late prenatal care affecting pregnancy in second trimester 26 wks; Encounter for supervision of normal first pregnancy in second trimester; Obesity affecting pregnancy in second trimester; Hx of intrinsic asthma; Anemia affecting pregnancy in second trimester; and possible hyperthyroidism--subclinical hyperthyroidism (no tx needed currently but check pp) on their problem list.  Patient reports no complaints.  Contractions: Not present. Vag. Bleeding: None.  Movement: Present. Denies leaking of fluid/ROM.   The following portions of the patient's history were reviewed and updated as appropriate: allergies, current medications, past family history, past medical history, past social history, past surgical history and problem list. Problem list updated.  Objective:   Vitals:   04/13/22 1348  BP: 112/65  Pulse: 71  Temp: 98.1 F (36.7 C)  Weight: 222 lb 12.8 oz (101.1 kg)    Fetal Status: Fetal Heart Rate (bpm): 130 Fundal Height: 35 cm Movement: Present     General:  Alert, oriented and cooperative. Patient is in no acute distress.  Skin: Skin is warm and dry. No rash noted.   Cardiovascular: Normal heart rate noted  Respiratory: Normal respiratory effort, no problems with respiration noted  Abdomen: Soft, gravid, appropriate for gestational age.  Pain/Pressure: Absent     Pelvic: Cervical exam deferred        Extremities: Normal range of motion.  Edema: None  Mental Status: Normal mood and affect. Normal behavior. Normal judgment and thought content.   Assessment and Plan:  Pregnancy: G1P0 at [redacted]w[redacted]d   - Hemoglobin, venipuncture  2. Obesity affecting pregnancy in second trimester, unspecified obesity type 23 lb 12.8 oz  (10.8 kg) [employer] pizza and toast with cream cheese, jam, and bacon  3. Late prenatal care affecting pregnancy in second trimester 26 wks   4. Encounter for supervision of normal first pregnancy in second trimester 23 lb 12.8 oz (10.8 kg) Hasn't chosen peds yet Took 1 week off work last week for [employer] birthday but normally works 30 hrs/wk Here with partner Walking 3x/wk x 20 min No car seat or crib yet Baby shower 05/05/22 Hasn't chosen peds yet Leg cramps resolved   5. Anemia affecting pregnancy in second trimester Taking FeSo4 I daily with juice   Preterm labor symptoms and general obstetric precautions including but not limited to vaginal bleeding, contractions, leaking of fluid and fetal movement were reviewed in detail with the patient. Please refer to After Visit Summary for other counseling recommendations.  Return in about 2 weeks (around 04/27/2022) for routine PNC.  No future appointments.  Herbie Saxon, CNM

## 2022-04-27 ENCOUNTER — Ambulatory Visit: Payer: Medicaid Other | Admitting: Advanced Practice Midwife

## 2022-04-27 VITALS — BP 116/64 | HR 88 | Temp 98.1°F | Wt 226.6 lb

## 2022-04-27 DIAGNOSIS — O99212 Obesity complicating pregnancy, second trimester: Secondary | ICD-10-CM

## 2022-04-27 DIAGNOSIS — O0932 Supervision of pregnancy with insufficient antenatal care, second trimester: Secondary | ICD-10-CM

## 2022-04-27 DIAGNOSIS — O0933 Supervision of pregnancy with insufficient antenatal care, third trimester: Secondary | ICD-10-CM

## 2022-04-27 DIAGNOSIS — O99012 Anemia complicating pregnancy, second trimester: Secondary | ICD-10-CM

## 2022-04-27 DIAGNOSIS — Z3403 Encounter for supervision of normal first pregnancy, third trimester: Secondary | ICD-10-CM

## 2022-04-27 DIAGNOSIS — O99013 Anemia complicating pregnancy, third trimester: Secondary | ICD-10-CM

## 2022-04-27 DIAGNOSIS — O99213 Obesity complicating pregnancy, third trimester: Secondary | ICD-10-CM

## 2022-04-27 DIAGNOSIS — Z3402 Encounter for supervision of normal first pregnancy, second trimester: Secondary | ICD-10-CM

## 2022-04-27 NOTE — Progress Notes (Addendum)
Patient here for MH RV at 35 weeks. Still considering pediatrician. Has baby shower planned for 05/05/22. Taking ASA, counseled to continue ASA and stop at 36 weeks. Marland KitchenMarland KitchenMarland KitchenBurt Knack, RN

## 2022-04-27 NOTE — Progress Notes (Signed)
[facility]  PRENATAL VISIT NOTE  Subjective:  Tanya Lambert is a 21 y.o. G1P0 at [redacted]w[redacted]d being seen today for ongoing prenatal care.  She is currently monitored for the following issues for this low-risk pregnancy and has Late prenatal care affecting pregnancy in second trimester 26 wks; Encounter for supervision of normal first pregnancy in second trimester; Obesity affecting pregnancy in second trimester BMI=29.3; Hx of intrinsic asthma; Anemia affecting pregnancy in second trimester; and possible hyperthyroidism--subclinical hyperthyroidism (no tx needed currently but check pp) on their problem list.  Patient reports  nasal congestion .  Contractions: Not present. Vag. Bleeding: None.  Movement: Present. Denies leaking of fluid/ROM.   The following portions of the patient's history were reviewed and updated as appropriate: allergies, current medications, past family history, past medical history, past social history, past surgical history and problem list. Problem list updated.  Objective:   Vitals:   04/27/22 1356  BP: 116/64  Pulse: 88  Temp: 98.1 F (36.7 C)  Weight: 226 lb 9.6 oz (102.8 kg)    Fetal Status: Fetal Heart Rate (bpm): 160 Fundal Height: 34 cm Movement: Present     General:  Alert, oriented and cooperative. Patient is in no acute distress.  Skin: Skin is warm and dry. No rash noted.   Cardiovascular: Normal heart rate noted  Respiratory: Normal respiratory effort, no problems with respiration noted  Abdomen: Soft, gravid, appropriate for gestational age.  Pain/Pressure: Absent     Pelvic: Cervical exam deferred        Extremities: Normal range of motion.  Edema: None  Mental Status: Normal mood and affect. Normal behavior. Normal judgment and thought content.   Assessment and Plan:  Pregnancy: G1P0 at 100w0d  1. Encounter for supervision of normal first pregnancy in second trimester [place] 20-25 hrs/wk Walking  2-3x/wk x 20 min No car seat or crib yet--waiting for baby shower on 05/05/22 Here with partner  2. Obesity affecting pregnancy in second trimester, unspecified obesity type 27 lb 9.6 oz (12.5 kg) Taking ASA 81 mg daily  3. Late prenatal care affecting pregnancy in second trimester 26 wks   4. Anemia affecting pregnancy in second trimester Taking FeSo4 I daily with juice  Preterm labor symptoms and general obstetric precautions including but not limited to vaginal bleeding, contractions, leaking of fluid and fetal movement were reviewed in detail with the patient. Please refer to After Visit Summary for other counseling recommendations.  Return in about 1 week (around 05/04/2022) for routine PNC.  No future appointments.  Alberteen Spindle, CNM

## 2022-05-04 ENCOUNTER — Ambulatory Visit: Payer: Medicaid Other | Admitting: Advanced Practice Midwife

## 2022-05-04 VITALS — BP 121/68 | HR 70 | Temp 97.7°F | Wt 223.0 lb

## 2022-05-04 DIAGNOSIS — O0933 Supervision of pregnancy with insufficient antenatal care, third trimester: Secondary | ICD-10-CM

## 2022-05-04 DIAGNOSIS — O0932 Supervision of pregnancy with insufficient antenatal care, second trimester: Secondary | ICD-10-CM

## 2022-05-04 DIAGNOSIS — O99213 Obesity complicating pregnancy, third trimester: Secondary | ICD-10-CM

## 2022-05-04 DIAGNOSIS — Z3403 Encounter for supervision of normal first pregnancy, third trimester: Secondary | ICD-10-CM

## 2022-05-04 DIAGNOSIS — O99212 Obesity complicating pregnancy, second trimester: Secondary | ICD-10-CM

## 2022-05-04 DIAGNOSIS — Z3402 Encounter for supervision of normal first pregnancy, second trimester: Secondary | ICD-10-CM

## 2022-05-04 NOTE — Progress Notes (Signed)
Patient here for MH RV at [redacted]w[redacted]d.   Patient self -collected 36 weeks vaginal swabs (instructions were given for collection).   Patient advised to stop ASA 81mg  today.   36 week packet handed to patient.   PHQ9 and abuse form filled today.   , RN

## 2022-05-04 NOTE — Progress Notes (Signed)
[facility]  PRENATAL VISIT NOTE  Subjective:  Tanya Lambert is a 21 y.o. G1P0 at [redacted]w[redacted]d being seen today for ongoing prenatal care.  She is currently monitored for the following issues for this low-risk pregnancy and has Late prenatal care affecting pregnancy in second trimester 26 wks; Encounter for supervision of normal first pregnancy in second trimester; Obesity affecting pregnancy in second trimester BMI=29.3; Hx of intrinsic asthma; Anemia affecting pregnancy in second trimester; and possible hyperthyroidism--subclinical hyperthyroidism (no tx needed currently but check pp) on their problem list.  Patient reports  sore throat, runny nose, yellow mucous since yesterday .  Contractions: Not present. Vag. Bleeding: None.  Movement: Present. Denies leaking of fluid/ROM.   The following portions of the patient's history were reviewed and updated as appropriate: allergies, current medications, past family history, past medical history, past social history, past surgical history and problem list. Problem list updated.  Objective:   Vitals:   05/04/22 1054  BP: 121/68  Pulse: 70  Temp: 97.7 F (36.5 C)  Weight: 223 lb (101.2 kg)    Fetal Status: Fetal Heart Rate (bpm): 133 Fundal Height: 36 cm Movement: Present  Presentation: Vertex  General:  Alert, oriented and cooperative. Patient is in no acute distress.  Skin: Skin is warm and dry. No rash noted.   Cardiovascular: Normal heart rate noted  Respiratory: Normal respiratory effort, no problems with respiration noted  Abdomen: Soft, gravid, appropriate for gestational age.  Pain/Pressure: Absent     Pelvic: Cervical exam deferred        Extremities: Normal range of motion.  Edema: None  Mental Status: Normal mood and affect. Normal behavior. Normal judgment and thought content.   Assessment and Plan:  Pregnancy: G1P0 at [redacted]w[redacted]d  1. Encounter for supervision of normal first pregnancy in  second trimester Hasn't confirmed peds yet No car seat or crib--awaiting baby shower tomorrow Knows when to go to L&D Working 20-25 hrs/wk Taking FeSo4 1 daily with juice Self collected 36 wk labs today C/o sore throat, runny/stuffy nose since yesterday; covid test neg   - Culture, beta strep (group b only) - Chlamydia/GC NAA, Confirmation  2. Obesity affecting pregnancy in second trimester, unspecified obesity type 24 lb (10.9 kg) To stop taking ASA 81 mg now Walking 2x/wk x 20 min  3. Late prenatal care affecting pregnancy in second trimester 26 wks   - Culture, beta strep (group b only) - Chlamydia/GC NAA, Confirmation   Preterm labor symptoms and general obstetric precautions including but not limited to vaginal bleeding, contractions, leaking of fluid and fetal movement were reviewed in detail with the patient. Please refer to After Visit Summary for other counseling recommendations.  Return in about 1 week (around 05/11/2022) for routine PNC.  Future Appointments  Date Time Provider Department Center  05/12/2022  3:00 PM [facility] PROVIDER [facility] None    Alberteen Spindle, CNM

## 2022-05-06 LAB — CHLAMYDIA/GC NAA, CONFIRMATION
Chlamydia trachomatis, NAA: NEGATIVE
Neisseria gonorrhoeae, NAA: NEGATIVE

## 2022-05-08 LAB — CULTURE, BETA STREP (GROUP B ONLY): Strep Gp B Culture: NEGATIVE

## 2022-05-12 ENCOUNTER — Ambulatory Visit: Payer: Medicaid Other | Admitting: Family Medicine

## 2022-05-12 ENCOUNTER — Ambulatory Visit: Payer: Medicaid Other

## 2022-05-12 VITALS — BP 124/65 | HR 65 | Temp 97.1°F | Wt 229.0 lb

## 2022-05-12 DIAGNOSIS — Z3403 Encounter for supervision of normal first pregnancy, third trimester: Secondary | ICD-10-CM

## 2022-05-12 DIAGNOSIS — O0932 Supervision of pregnancy with insufficient antenatal care, second trimester: Secondary | ICD-10-CM

## 2022-05-12 DIAGNOSIS — O99012 Anemia complicating pregnancy, second trimester: Secondary | ICD-10-CM

## 2022-05-12 DIAGNOSIS — O99213 Obesity complicating pregnancy, third trimester: Secondary | ICD-10-CM

## 2022-05-12 NOTE — Progress Notes (Signed)
[facility]  PRENATAL VISIT NOTE  Subjective:  Tanya Lambert is a 21 y.o. G1P0 at [redacted]w[redacted]d being seen today for ongoing prenatal care.  She is currently monitored for the following issues for this high-risk pregnancy and has Late prenatal care affecting pregnancy in second trimester 26 wks; Encounter for supervision of normal first pregnancy in second trimester; Obesity affecting pregnancy in second trimester BMI=29.3; Hx of intrinsic asthma; Anemia affecting pregnancy in second trimester; and possible hyperthyroidism--subclinical hyperthyroidism (no tx needed currently but check pp) on their problem list.  Patient reports no complaints.  Contractions: Not present. Vag. Bleeding: None.  Movement: Present. Denies leaking of fluid/ROM.   The following portions of the patient's history were reviewed and updated as appropriate: allergies, current medications, past family history, past medical history, past social history, past surgical history and problem list. Problem list updated.  Objective:   Vitals:   05/12/22 1520  BP: 124/65  Pulse: 65  Temp: (!) 97.1 F (36.2 C)  Weight: 229 lb (103.9 kg)    Fetal Status: Fetal Heart Rate (bpm): 130 Fundal Height: 38 cm Movement: Present  Presentation: Vertex  General:  Alert, oriented and cooperative. Patient is in no acute distress.  Skin: Skin is warm and dry. No rash noted.   Cardiovascular: Normal heart rate noted  Respiratory: Normal respiratory effort, no problems with respiration noted  Abdomen: Soft, gravid, appropriate for gestational age.  Pain/Pressure: Absent     Pelvic: Cervical exam performed Dilation: Fingertip Effacement (%): Thick Station: Ballotable  Extremities: Normal range of motion.     Mental Status: Normal mood and affect. Normal behavior. Normal judgment and thought content.   Assessment and Plan:  Pregnancy: G1P0 at [redacted]w[redacted]d  1. Anemia affecting pregnancy in second  trimester Last hgb was 11.9, responding to therapy  2. Encounter for supervision of normal first pregnancy in third trimester Discussed birth plan and she is planning on bring it to the hospital. Discussed specifically the why behind IV placement and encourage her to discuss with labor team and she is interested in [facility]. Planning to have [place] at delivery as well as her mom. She is going to make a birth playlist Reviewed ways to start labor and release of oxytocin Discussed IOL at 41 wk Reviewed s/sx of labor  3. Obesity affecting pregnancy in third trimester, unspecified obesity type Twg= 30 lb (13.6 kg) which is above goal  4. Late prenatal care affecting pregnancy in second trimester 26 wks Up to date   Term labor symptoms and general obstetric precautions including but not limited to vaginal bleeding, contractions, leaking of fluid and fetal movement were reviewed in detail with the patient. Please refer to After Visit Summary for other counseling recommendations.  Return in about 1 week (around 05/19/2022) for Routine prenatal care.  Future Appointments  Date Time Provider Department Center  05/20/2022  8:20 AM [facility] PROVIDER [facility] None  05/25/2022  1:20 PM [facility] PROVIDER [facility] None    Federico Flake, MD

## 2022-05-14 ENCOUNTER — Telehealth: Payer: Self-pay | Admitting: Family Medicine

## 2022-05-14 NOTE — Telephone Encounter (Signed)
Spoke with provider in clinic, A. Streilein PA-C regarding this patient's question. Provider stated we can assess patient's breast during next MH RV and send referral as needed.   Called patient and informed her of provider's recommendation and informed patient it is normal to have changes in breast, especially in the last few weeks. Patient verbalized understanding.   Earlyne Iba, RN

## 2022-05-14 NOTE — Telephone Encounter (Signed)
Patient wants to know if she can get a referral for a mammogram. Pt states that she received a breast exam at her first prenatal appointment and that she was told that it didn't feel normal. Patient states she is still having issues and that it feels like she has a lump.

## 2022-05-20 ENCOUNTER — Encounter: Payer: Self-pay | Admitting: Nurse Practitioner

## 2022-05-20 ENCOUNTER — Ambulatory Visit: Payer: Medicaid Other | Admitting: Nurse Practitioner

## 2022-05-20 VITALS — BP 122/78 | HR 74 | Temp 97.9°F | Wt 233.0 lb

## 2022-05-20 DIAGNOSIS — Z3403 Encounter for supervision of normal first pregnancy, third trimester: Secondary | ICD-10-CM

## 2022-05-20 DIAGNOSIS — O99012 Anemia complicating pregnancy, second trimester: Secondary | ICD-10-CM

## 2022-05-20 DIAGNOSIS — N6459 Other signs and symptoms in breast: Secondary | ICD-10-CM

## 2022-05-20 DIAGNOSIS — O99212 Obesity complicating pregnancy, second trimester: Secondary | ICD-10-CM

## 2022-05-20 NOTE — Progress Notes (Addendum)
[facility]  PRENATAL VISIT NOTE  Subjective:  Tanya Lambert is a 21 y.o. G1P0 at [redacted]w[redacted]d being seen today for ongoing prenatal care.  She is currently monitored for the following issues for this high-risk pregnancy and has Late prenatal care affecting pregnancy in second trimester 26 wks; Encounter for supervision of normal first pregnancy in second trimester; Obesity affecting pregnancy in second trimester BMI=29.3; Hx of intrinsic asthma; Anemia affecting pregnancy in second trimester; and possible hyperthyroidism--subclinical hyperthyroidism (no tx needed currently but check pp) on their problem list.  Patient reports  some breast tissue changes .  Contractions: Not present. Vag. Bleeding: None.  Movement: Present. Denies leaking of fluid/ROM.   The following portions of the patient's history were reviewed and updated as appropriate: allergies, current medications, past family history, past medical history, past social history, past surgical history and problem list. Problem list updated.  Objective:   Vitals:   05/20/22 0825  BP: 122/78  Pulse: 74  Temp: 97.9 F (36.6 C)  Weight: 233 lb (105.7 kg)    Fetal Status: Fetal Heart Rate (bpm): 130 Fundal Height: 38 cm Movement: Present  Presentation: Vertex  General:  Alert, oriented and cooperative. Patient is in no acute distress.  Skin: Skin is warm and dry. No rash noted.   Cardiovascular: Normal heart rate noted  Respiratory: Normal respiratory effort, no problems with respiration noted  Abdomen: Soft, gravid, appropriate for gestational age.  Pain/Pressure: Present     Pelvic: Cervical exam deferred        Extremities: Normal range of motion.  Edema: None  Mental Status: Normal mood and affect. Normal behavior. Normal judgment and thought content.   Assessment and Plan:  Pregnancy: G1P0 at [redacted]w[redacted]d  1. Encounter for supervision of normal first pregnancy in third trimester -21 year old  female in clinic today for prenatal care. -Patient taking PNV daily. -ROS, abnormal breast tissue changes.  Patient desires to have evaluation of breast today.   2. Obesity affecting pregnancy in second trimester, unspecified obesity type -Patient reports walking for at least 20 minutes twice a week.  Continue with regular exercise.   -34 lb (15.4 kg)   3. Anemia affecting pregnancy in second trimester -Patient reports taking Iron daily with juice, separate from PNV.    4. Abnormal breast tissue -Abnormal breast tissue noted bilaterally.   Peau d'orange edematous breast tissue noted.  Breast tissue also warm to touch and tender.  [facility] MFM contact regarding an urgent follow up.  Referral submitted to [facility] for evaluation.  Will follow up for appointment time and date.  Will also evaluate at next MHRV.    Term labor symptoms and general obstetric precautions including but not limited to vaginal bleeding, contractions, leaking of fluid and fetal movement were reviewed in detail with the patient. Please refer to After Visit Summary for other counseling recommendations.   Return in about 1 week (around 05/27/2022) for Routine prenatal care visit.  Future Appointments  Date Time Provider Department Center  05/25/2022  1:20 PM [facility] PROVIDER [facility] None    Glenna Fellows, FNP

## 2022-05-20 NOTE — Progress Notes (Addendum)
Faxed referral to [facility] MFM with follow up phone call. Confirmed that [facility] has referral. MFM recommended Breast U/S. Faxed [facility] (Mammography) referral. Confirmed that this fax was received. Bthiele RN

## 2022-05-23 DIAGNOSIS — Z3403 Encounter for supervision of normal first pregnancy, third trimester: Secondary | ICD-10-CM | POA: Diagnosis not present

## 2022-05-25 ENCOUNTER — Ambulatory Visit: Payer: Medicaid Other

## 2022-05-26 ENCOUNTER — Telehealth: Payer: Self-pay

## 2022-05-26 NOTE — Telephone Encounter (Signed)
Call to [facility] to ascertain if breast US scheduled. Myriam Jacobson entered info from referral and stated she would call client this am. Few hours after initial call this am, [facility] reviewed and client has bilateral breast US scheduled for 05/28/2022 at 0850 and [facility]. Jossie Ng, RN

## 2022-07-03 ENCOUNTER — Telehealth: Payer: Self-pay

## 2022-07-03 NOTE — Telephone Encounter (Signed)
Call to client as delivered 05/24/2023 at [facility] and post-partum exam due next week. Call to client who states has post-partum appt with Nexplanon insertion scheduled for 07/13/2022 at [facility] with Dr. Verdene Rio. RN offered congratulations on birth of baby. Rich Number, RN

## 2023-04-05 NOTE — Progress Notes (Signed)
04-05-23 for 04-02-23, [facility] received a request from [employer] for medical records for the purposes of HEDIS activities for dates of service 2023-2024 for patient Tanya Lambert, dob: Nov 12, 2000. Per request medical records released include the following: Prenatal records include: 02-26-22, 03-09-22, 03-30-22, 04-13-22, 04-27-22, 05-04-22, 05-12-22; lab results from: 02-26-22, 03-09-22, and 05-04-22. Medical records were mailed certified mail to [facility], [address] [place], [place] [address]. Herby Abraham RN
